# Patient Record
Sex: Male | Born: 2000 | Race: White | Hispanic: No | Marital: Single | Smoking: Never smoker
Health system: Southern US, Community
[De-identification: ages and names within clinical notes are randomized; demographics above are authoritative.]

## PROBLEM LIST (undated history)

## (undated) DIAGNOSIS — J45909 Unspecified asthma, uncomplicated: Secondary | ICD-10-CM

## (undated) HISTORY — DX: Unspecified asthma, uncomplicated: J45.909

---
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
## 2021-02-21 ENCOUNTER — Ambulatory Visit (HOSPITAL_COMMUNITY)
Admission: EM | Admit: 2021-02-21 | Discharge: 2021-02-21 | Disposition: A | Discharge status: Home / Self Care | Payer: Medicaid Other | Attending: Family Medicine | Admitting: Family Medicine

## 2021-02-21 ENCOUNTER — Encounter (HOSPITAL_COMMUNITY): Payer: Self-pay | Admitting: *Deleted

## 2021-02-21 ENCOUNTER — Other Ambulatory Visit: Payer: Self-pay

## 2021-02-21 DIAGNOSIS — K0889 Other specified disorders of teeth and supporting structures: Secondary | ICD-10-CM

## 2021-02-21 MED ORDER — LIDOCAINE VISCOUS HCL 2 % MT SOLN
10.0000 mL | OROMUCOSAL | 0 refills | Status: AC | PRN
Start: 2021-02-21 — End: ?

## 2021-02-21 MED ORDER — AMOXICILLIN-POT CLAVULANATE 875-125 MG PO TABS: 1.0000 | ORAL_TABLET | Freq: Two times a day (BID) | ORAL | 0 refills | Status: AC

## 2021-02-21 NOTE — ED Triage Notes (Signed)
Pt reports Lt upper dental pain. 

## 2021-02-21 NOTE — ED Provider Notes (Signed)
[facility]    CSN: [id number] Arrival date & time: 02/21/21  1216      History   Chief Complaint Chief Complaint  Patient presents with   Dental Pain    HPI Jermaine Dixon is a 20 y.o. male.   Patient presenting today with 5-day history of worsening left upper posterior molar dental pain.  Denies drainage, fever, headaches, dysphagia.  Has been trying Orajel and ibuprofen with minimal relief.  Does not have a dentist at this time.  No past history of chronic dental issues.   Past Medical History:  Diagnosis Date   Asthma     Patient Active Problem List   Diagnosis Date Noted   Allergic rhinitis 10/10/2014   Obesity, unspecified 01/22/2013   Angioedema 10/23/2012   Abdominal pain, other specified site 09/20/2012   Chest pain 09/20/2012    History reviewed. No pertinent surgical history.     Home Medications    Prior to Admission medications   Medication Sig Start Date End Date Taking? Authorizing Provider  amoxicillin-clavulanate (AUGMENTIN) 875-125 MG tablet Take 1 tablet by mouth every 12 (twelve) hours. 02/21/21  Yes Particia Nearing, PA-C  lidocaine (XYLOCAINE) 2 % solution Use as directed 10 mLs in the mouth or throat as needed for mouth pain. 02/21/21  Yes Particia Nearing, PA-C  ketoconazole (NIZORAL) 2 % shampoo Apply 1 application topically 2 (two) times a week. 07/21/17   Merlyn Albert, MD    Family History History reviewed. No pertinent family history.  Social History Social History   Tobacco Use   Smoking status: Never   Smokeless tobacco: Never  Substance Use Topics   Alcohol use: Never   Drug use: Never     Allergies   Cefzil [cefprozil], Influenza vaccines, and Robitussin (alcohol free) [guaifenesin]   Review of Systems Review of Systems Per HPI  Physical Exam Triage Vital Signs ED Triage Vitals  Enc Vitals Group     BP 02/21/21 1356 (!) 141/91     Pulse Rate 02/21/21 1356 65     Resp 02/21/21 1356  16     Temp 02/21/21 1356 98.9 F (37.2 C)     Temp src --      SpO2 02/21/21 1356 98 %     Weight --      Height --      Head Circumference --      Peak Flow --      Pain Score 02/21/21 1357 9     Pain Loc --      Pain Edu? --      Excl. in GC? --    No data found.  Updated Vital Signs BP (!) 141/91   Pulse 65   Temp 98.9 F (37.2 C)   Resp 16   SpO2 98%   Visual Acuity Right Eye Distance:   Left Eye Distance:   Bilateral Distance:    Right Eye Near:   Left Eye Near:    Bilateral Near:     Physical Exam Vitals and nursing note reviewed.  Constitutional:      Appearance: Normal appearance.  HENT:     Head: Atraumatic.     Mouth/Throat:     Mouth: Mucous membranes are moist.     Pharynx: Oropharynx is clear.     Comments: Gingival edema and erythema surrounding posterior left upper molar with some areas of decay Eyes:     Extraocular Movements: Extraocular movements intact.  Conjunctiva/sclera: Conjunctivae normal.  Cardiovascular:     Rate and Rhythm: Normal rate and regular rhythm.  Pulmonary:     Effort: Pulmonary effort is normal.     Breath sounds: Normal breath sounds.  Musculoskeletal:        General: Normal range of motion.     Cervical back: Normal range of motion and neck supple.  Skin:    General: Skin is warm and dry.  Neurological:     General: No focal deficit present.     Mental Status: He is oriented to person, place, and time.  Psychiatric:        Mood and Affect: Mood normal.        Thought Content: Thought content normal.        Judgment: Judgment normal.     UC Treatments / Results  Labs (all labs ordered are listed, but only abnormal results are displayed) Labs Reviewed - No data to display  EKG   Radiology No results found.  Procedures Procedures (including critical care time)  Medications Ordered in UC Medications - No data to display  Initial Impression / Assessment and Plan / UC Course  I have reviewed the  triage vital signs and the nursing notes.  Pertinent labs & imaging results that were available during my care of the patient were reviewed by me and considered in my medical decision making (see chart for details).     Treat with Augmentin, viscous lidocaine, over-the-counter pain relievers, salt water gargles.  Follow-up with dentist for further evaluation as soon as possible  Final Clinical Impressions(s) / UC Diagnoses   Final diagnoses:  Pain, dental   Discharge Instructions   None    ED Prescriptions     Medication Sig Dispense Auth. Provider   amoxicillin-clavulanate (AUGMENTIN) 875-125 MG tablet Take 1 tablet by mouth every 12 (twelve) hours. 14 tablet Particia Nearing, [place]   lidocaine (XYLOCAINE) 2 % solution Use as directed 10 mLs in the mouth or throat as needed for mouth pain. 100 mL Particia Nearing, [place]      PDMP not reviewed this encounter.   Particia Nearing, [place] 02/21/21 [phone]
